# Patient Record
Sex: Male | Born: 1990 | Race: Black or African American | Hispanic: No | Marital: Single | Smoking: Never smoker
Health system: Southern US, Community
[De-identification: ages and names within clinical notes are randomized; demographics above are authoritative.]

---
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
## 2017-09-27 ENCOUNTER — Encounter (HOSPITAL_COMMUNITY): Payer: Self-pay | Admitting: Emergency Medicine

## 2017-09-27 ENCOUNTER — Emergency Department (HOSPITAL_COMMUNITY)
Admission: EM | Admit: 2017-09-27 | Discharge: 2017-09-27 | Disposition: A | Discharge status: Home / Self Care | Payer: Worker's Compensation | Attending: Emergency Medicine | Admitting: Emergency Medicine

## 2017-09-27 ENCOUNTER — Emergency Department (HOSPITAL_COMMUNITY): Payer: Worker's Compensation

## 2017-09-27 DIAGNOSIS — M25522 Pain in left elbow: Secondary | ICD-10-CM | POA: Diagnosis not present

## 2017-09-27 DIAGNOSIS — Y929 Unspecified place or not applicable: Secondary | ICD-10-CM | POA: Insufficient documentation

## 2017-09-27 DIAGNOSIS — Y9301 Activity, walking, marching and hiking: Secondary | ICD-10-CM | POA: Diagnosis not present

## 2017-09-27 DIAGNOSIS — W0110XA Fall on same level from slipping, tripping and stumbling with subsequent striking against unspecified object, initial encounter: Secondary | ICD-10-CM | POA: Diagnosis not present

## 2017-09-27 DIAGNOSIS — Y990 Civilian activity done for income or pay: Secondary | ICD-10-CM | POA: Diagnosis not present

## 2017-09-27 MED ORDER — NAPROXEN 500 MG PO TABS: 500.0000 mg | ORAL_TABLET | Freq: Two times a day (BID) | ORAL | 0 refills | Status: AC

## 2017-09-27 NOTE — Discharge Instructions (Signed)
We advise rest with the use of an arm sling.  Ice your elbow 3-4 times per day for 15-20 minutes each time.  We also recommend the use of daily naproxen as prescribed.  Follow-up with an orthopedist for further evaluation of your injuries.  You may return to the emergency department for new or concerning symptoms.

## 2017-09-27 NOTE — ED Provider Notes (Signed)
[facility] Provider Note   CSN: [id number] Arrival date & time: 09/27/17  0225     History   Chief Complaint Chief Complaint  Patient presents with  . Fall    Elbow Injury    HPI Darryl Davis is a 27 y.o. male.  27 year old male with no significant past medical history presents to the emergency department for evaluation of left elbow pain.  Patient states that he was at work when he slipped and fell striking his elbow on the ground.  Patient reports feeling as though it looked deformed.  He was able to bend his elbow which moderately improved his symptoms.  Patient notes that his elbow seemed more properly aligned following this manipulation.  He has had some pain which was mildly improved with fentanyl given by EMS.  He has not had any numbness or paresthesias.  No color change.  No history of prior injury to the elbow.  He is left-hand dominant.      History reviewed. No pertinent past medical history.  There are no active problems to display for this patient.   History reviewed. No pertinent surgical history.     Home Medications    Prior to Admission medications   Medication Sig Start Date End Date Taking? Authorizing Provider  naproxen (NAPROSYN) 500 MG tablet Take 1 tablet (500 mg total) by mouth 2 (two) times daily. 09/27/17   Antony MaduraHumes, Nivek Powley, PA-C    Family History No family history on file.  Social History Social History   Tobacco Use  . Smoking status: Never Smoker  . Smokeless tobacco: Never Used  Substance Use Topics  . Alcohol use: No    Frequency: Never  . Drug use: No     Allergies   Patient has no known allergies.   Review of Systems Review of Systems Ten systems reviewed and are negative for acute change, except as noted in the HPI.    Physical Exam Updated Vital Signs BP (!) 152/91 (BP Location: Left Arm)   Pulse 87   Temp 98.3 F (36.8 C) (Oral)   Resp 16   Ht 5\' 9"  (1.753 m)   Wt 95.3 kg  (210 lb)   SpO2 100%   BMI 31.01 kg/m   Physical Exam  Constitutional: He is oriented to person, place, and time. He appears well-developed and well-nourished. No distress.  Nontoxic appearing and in NAD  HENT:  Head: Normocephalic and atraumatic.  Eyes: Conjunctivae and EOM are normal. No scleral icterus.  Neck: Normal range of motion.  Cardiovascular: Normal rate, regular rhythm and intact distal pulses.  Distal radial pulse 2+ in the left upper extremity  Pulmonary/Chest: Effort normal. No respiratory distress.  Respirations even and unlabored  Musculoskeletal: Normal range of motion.       Left elbow: He exhibits normal range of motion and no deformity. Tenderness found. Radial head tenderness noted.  Neurological: He is alert and oriented to person, place, and time. He exhibits normal muscle tone. Coordination normal.  Grip strength 5/5 in the left hand  Skin: Skin is warm and dry. No rash noted. He is not diaphoretic. No erythema. No pallor.  Psychiatric: He has a normal mood and affect. His behavior is normal.  Nursing note and vitals reviewed.    ED Treatments / Results  Labs (all labs ordered are listed, but only abnormal results are displayed) Labs Reviewed - No data to display  EKG  EKG Interpretation None  Radiology Dg Elbow Complete Left  Result Date: 09/27/2017 CLINICAL DATA:  Status post fall, with left elbow injury. Left elbow pain and swelling. Initial encounter. EXAM: LEFT ELBOW - COMPLETE 3+ VIEW COMPARISON:  None. FINDINGS: There is question of some degree of subluxation at the articulation between the ulna and distal humerus, suggested only on two views. There is mild cortical irregularity at the coronoid process on a single view, which may be artifactual in nature. No definite dislocation is seen. No elbow joint effusion is identified. The soft tissues are grossly unremarkable in appearance. IMPRESSION: Question of some degree of subluxation at the  articulation between the ulna and distal humerus, suggested only on two views. Mild cortical irregularity on the coronoid process on a single view, which may be artifactual in nature. No definite dislocation seen. Would correlate for any associated symptoms. Electronically Signed   By: Roanna Raider M.D.   On: 09/27/2017 03:22    Procedures Procedures (including critical care time)  Medications Ordered in ED Medications - No data to display   Initial Impression / Assessment and Plan / ED Course  I have reviewed the triage vital signs and the nursing notes.  Pertinent labs & imaging results that were available during my care of the patient were reviewed by me and considered in my medical decision making (see chart for details).     27 year old male presents to the emergency department for evaluation of left elbow pain after a fall.  He is neurovascularly intact on exam and has full range of motion with left elbow flexion and extension.  There is question of a degree of subluxation at the articulation between the ulna and distal humerus on x-ray.  This is only visible on 2 views.  No evidence of improper alignment on bedside examination.  The patient has been placed in a foam arm sling.  Will refer to orthopedics for follow-up.  Return precautions discussed and provided. Patient discharged in stable condition with no unaddressed concerns.   Final Clinical Impressions(s) / ED Diagnoses   Final diagnoses:  Left elbow pain    ED Discharge Orders        Ordered    naproxen (NAPROSYN) 500 MG tablet  2 times daily     09/27/17 0425       Antony Madura, PA-C 09/27/17 0602    Ward, Layla Maw, DO 09/27/17 [phone]

## 2017-09-27 NOTE — ED Triage Notes (Signed)
Patient arrived with EMS from work slipped and fell this evening and injured his elbow with pain and mild swelling , denies LOC /ambulatory , distal pulses/sensation present . He received Fentanyl 100 mcg IV prior to arrival with relief.

## 2017-09-27 NOTE — ED Notes (Signed)
Patient transported to X-ray 

## 2019-05-04 IMAGING — DX DG ELBOW COMPLETE 3+V*L*
5 series · 5 of 5 positions shown · non-contrast
Comparison: None.

CLINICAL DATA: Status post fall, with left elbow injury. Left elbow
pain and swelling. Initial encounter.

EXAM:
LEFT ELBOW - COMPLETE 3+ VIEW

[elbow ap (1 of 2)]
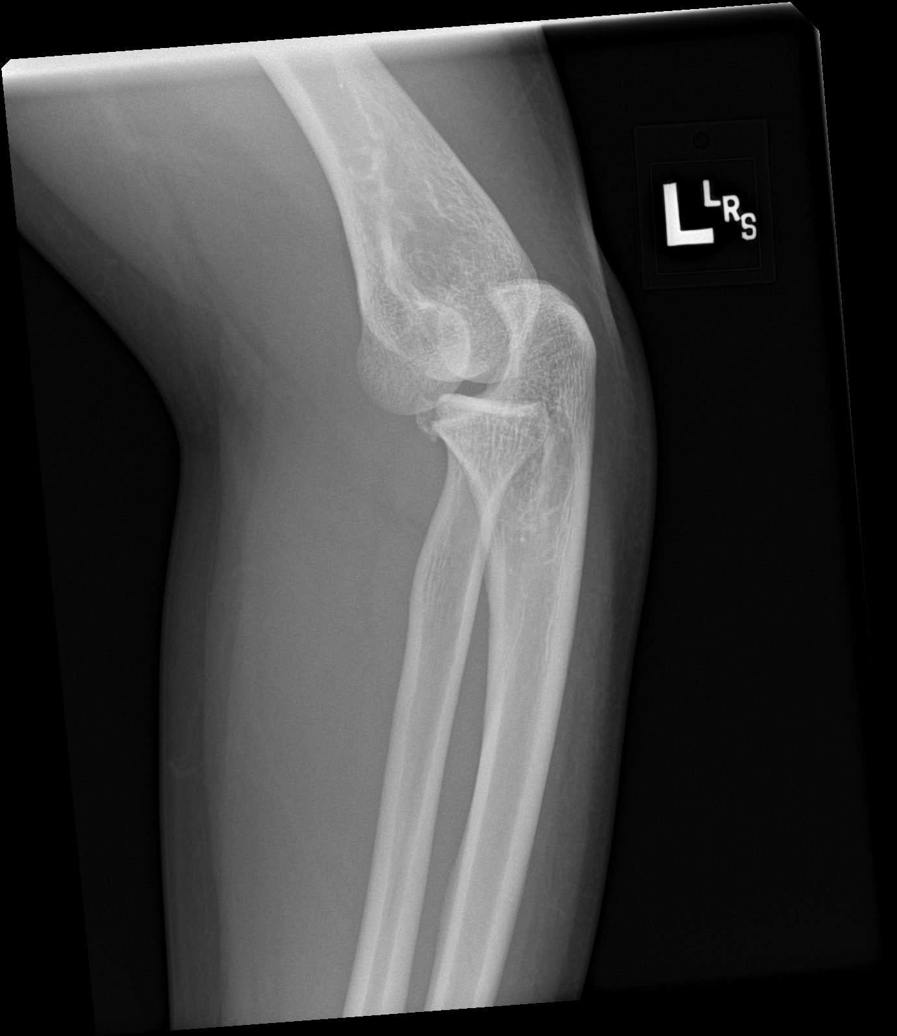

[elbow obl (1 of 2)]
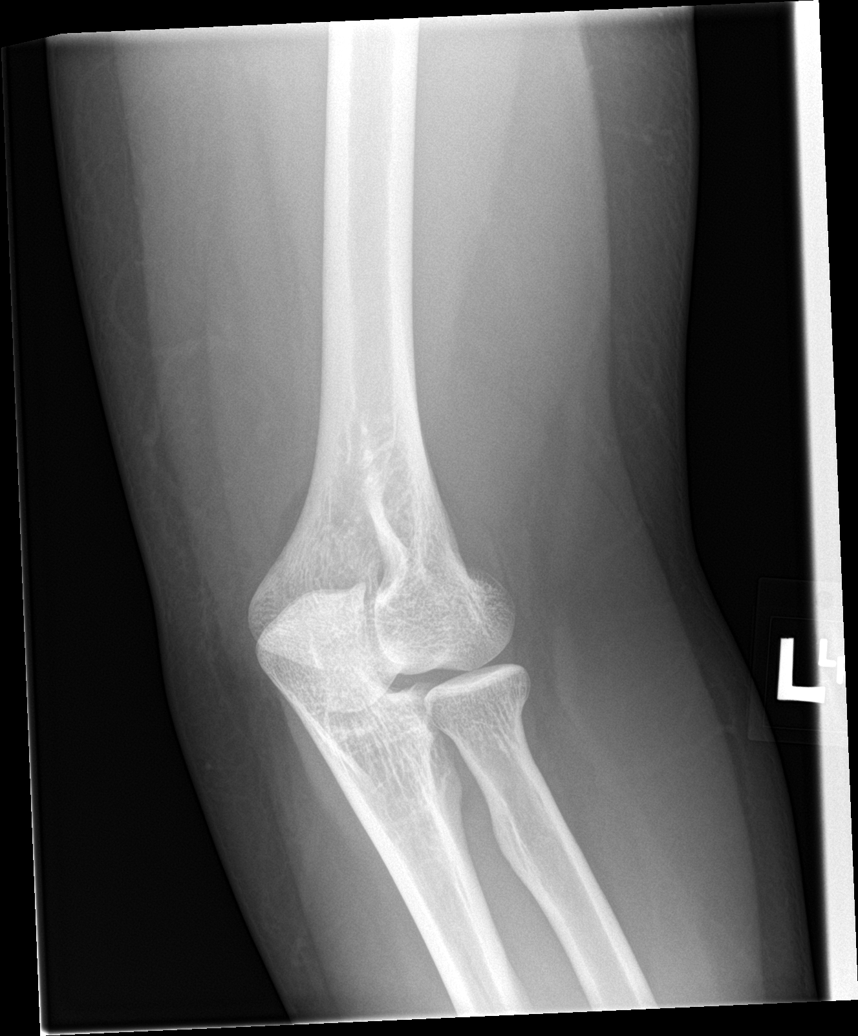

[elbow obl (2 of 2)]
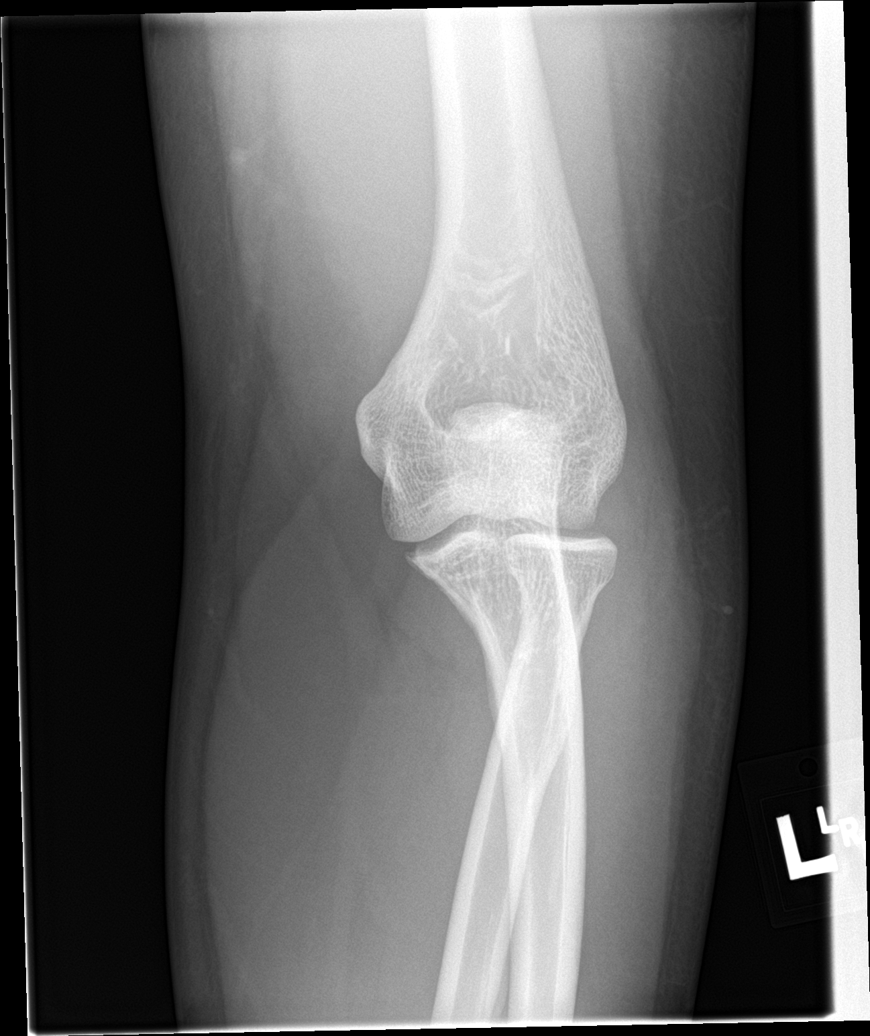

[elbow lat]
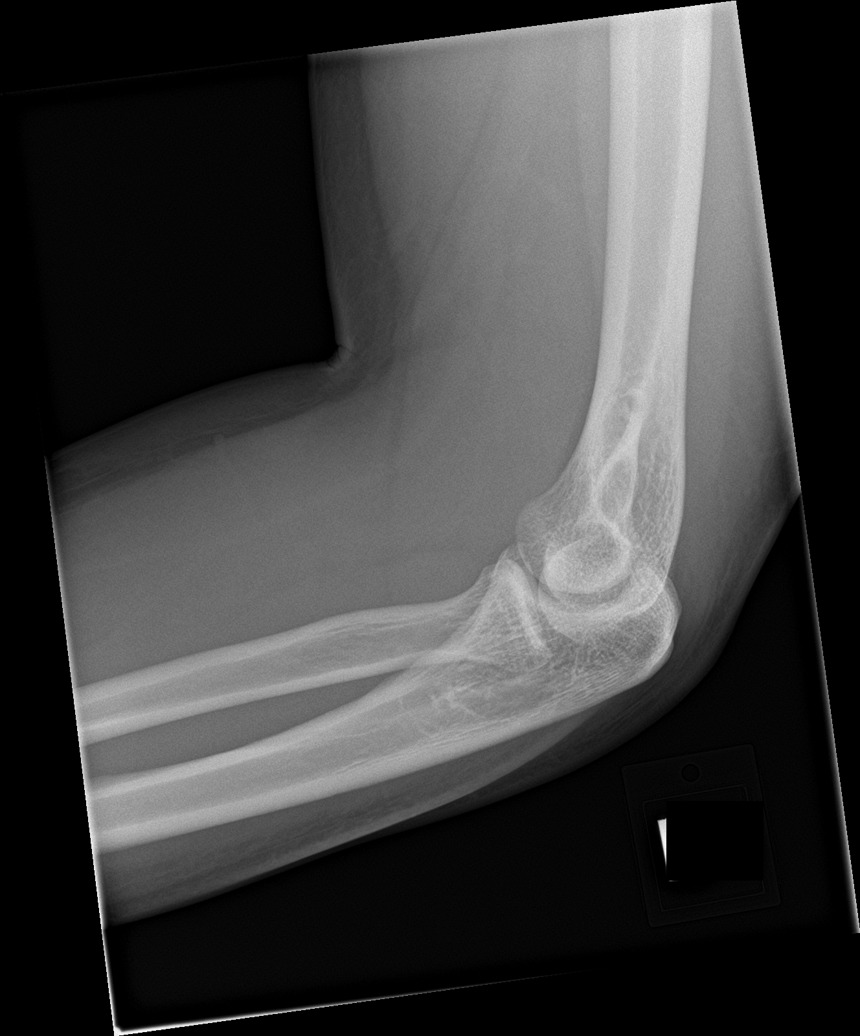

[elbow ap (2 of 2)]
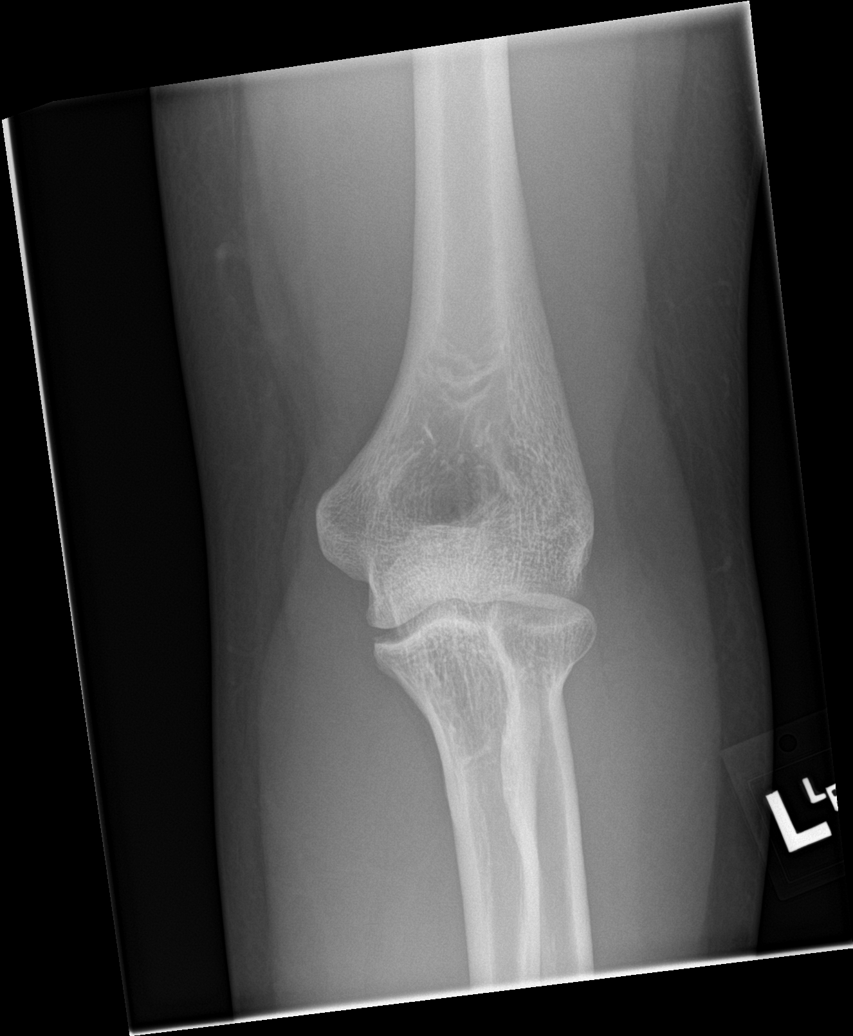

[5 of 5 positions shown; findings below may reference images not displayed]

FINDINGS: There is question of some degree of subluxation at the articulation
between the ulna and distal humerus, suggested only on two views.
There is mild cortical irregularity at the coronoid process on a
single view, which may be artifactual in nature. No definite
dislocation is seen. No elbow joint effusion is identified.

The soft tissues are grossly unremarkable in appearance.
IMPRESSION: Question of some degree of subluxation at the articulation between
the ulna and distal humerus, suggested only on two views. Mild
cortical irregularity on the coronoid process on a single view,
which may be artifactual in nature. No definite dislocation seen.
Would correlate for any associated symptoms.
